# Patient Record
Sex: Male | Born: 1975 | Hispanic: Yes | Marital: Single | Smoking: Never smoker
Health system: Southern US, Community
[De-identification: ages and names within clinical notes are randomized; demographics above are authoritative.]

---

## 1998-08-25 ENCOUNTER — Emergency Department (HOSPITAL_COMMUNITY): Admission: EM | Admit: 1998-08-25 | Discharge: 1998-08-25 | Payer: Self-pay | Admitting: Emergency Medicine

## 1998-08-25 ENCOUNTER — Encounter: Payer: Self-pay | Admitting: Emergency Medicine

## 1998-08-28 ENCOUNTER — Ambulatory Visit (HOSPITAL_COMMUNITY): Admission: RE | Admit: 1998-08-28 | Discharge: 1998-08-28 | Payer: Self-pay | Admitting: Emergency Medicine

## 2013-12-04 ENCOUNTER — Ambulatory Visit (HOSPITAL_COMMUNITY)
Admission: RE | Admit: 2013-12-04 | Discharge: 2013-12-04 | Disposition: A | Discharge status: Home / Self Care | Payer: Self-pay | Source: Ambulatory Visit | Attending: Diagnostic Radiology | Admitting: Diagnostic Radiology

## 2013-12-04 ENCOUNTER — Other Ambulatory Visit (HOSPITAL_COMMUNITY): Payer: Self-pay | Admitting: Chiropractic Medicine

## 2013-12-04 DIAGNOSIS — M545 Low back pain: Secondary | ICD-10-CM

## 2013-12-04 DIAGNOSIS — M542 Cervicalgia: Secondary | ICD-10-CM

## 2016-05-03 ENCOUNTER — Ambulatory Visit (INDEPENDENT_AMBULATORY_CARE_PROVIDER_SITE_OTHER): Payer: Self-pay | Admitting: Urgent Care

## 2016-05-03 ENCOUNTER — Encounter (HOSPITAL_COMMUNITY): Payer: Self-pay

## 2016-05-03 ENCOUNTER — Encounter: Payer: Self-pay | Admitting: Urgent Care

## 2016-05-03 ENCOUNTER — Emergency Department (HOSPITAL_COMMUNITY)
Admission: EM | Admit: 2016-05-03 | Discharge: 2016-05-03 | Disposition: A | Discharge status: Home / Self Care | Payer: Self-pay | Attending: Emergency Medicine | Admitting: Emergency Medicine

## 2016-05-03 VITALS — BP 120/78 | HR 69 | Temp 98.1°F | Resp 18

## 2016-05-03 DIAGNOSIS — S61012A Laceration without foreign body of left thumb without damage to nail, initial encounter: Secondary | ICD-10-CM | POA: Insufficient documentation

## 2016-05-03 DIAGNOSIS — Y990 Civilian activity done for income or pay: Secondary | ICD-10-CM | POA: Insufficient documentation

## 2016-05-03 DIAGNOSIS — S65412A Laceration of blood vessel of left thumb, initial encounter: Secondary | ICD-10-CM

## 2016-05-03 DIAGNOSIS — Y929 Unspecified place or not applicable: Secondary | ICD-10-CM | POA: Insufficient documentation

## 2016-05-03 DIAGNOSIS — Y939 Activity, unspecified: Secondary | ICD-10-CM | POA: Insufficient documentation

## 2016-05-03 DIAGNOSIS — Z23 Encounter for immunization: Secondary | ICD-10-CM

## 2016-05-03 DIAGNOSIS — Z026 Encounter for examination for insurance purposes: Secondary | ICD-10-CM

## 2016-05-03 DIAGNOSIS — M79645 Pain in left finger(s): Secondary | ICD-10-CM

## 2016-05-03 DIAGNOSIS — W260XXA Contact with knife, initial encounter: Secondary | ICD-10-CM | POA: Insufficient documentation

## 2016-05-03 MED ORDER — LIDOCAINE HCL 1 % IJ SOLN
20.0000 mL | Freq: Once | INTRAMUSCULAR | Status: DC
  Filled 2016-05-03: qty 20

## 2016-05-03 MED ORDER — KETOROLAC TROMETHAMINE 60 MG/2ML IM SOLN: 60.0000 mg | Freq: Once | INTRAMUSCULAR | Status: DC

## 2016-05-03 MED ORDER — HYDROCODONE-ACETAMINOPHEN 5-325 MG PO TABS: 2.0000 | ORAL_TABLET | ORAL | 0 refills | Status: DC | PRN

## 2016-05-03 MED ORDER — LIDOCAINE HCL 2 % IJ SOLN
10.0000 mL | Freq: Once | INTRAMUSCULAR | Status: AC
  Administered 2016-05-03: 200 mg
  Filled 2016-05-03: qty 20

## 2016-05-03 NOTE — Progress Notes (Signed)
   MRN: [id number] DOB: 11/19/1975  Subjective:   Logan Townsend is a 41 y.o. male presenting for worker's comp visit.   Reports suffering a left thumb laceration while using a knife at work. Patient was cutting tile, knife slipped and cut his hand. He reports severe pain, difficulty moving thumb, profuse bleeding. He wrapped his thumb in a towel, did not wash or clean his hand. Cannot recall the last time he received a tetanus update.   Logan Townsend's medications list, allergies, past medical history and past surgical history were reviewed and excluded from this note due to being a worker's comp case.   Objective:   Vitals: BP 120/78 (BP Location: Right Arm, Patient Position: Sitting, Cuff Size: Small)   Pulse 69   Temp 98.1 F (36.7 C) (Oral)   Resp 18   SpO2 99%   Physical Exam  Constitutional: He is oriented to person, place, and time. He appears well-developed and well-nourished.  Cardiovascular: Normal rate.   Pulmonary/Chest: Effort normal.  Musculoskeletal:       Left hand: He exhibits decreased range of motion (flexion of left thumb), tenderness (over laceration), laceration (~3cm deep laceration) and swelling (of left thumb). He exhibits no bony tenderness, normal two-point discrimination, normal capillary refill and no deformity. Normal sensation noted. Decreased strength noted. He exhibits finger abduction and thumb/finger opposition.       Hands: Neurological: He is alert and oriented to person, place, and time.   Assessment and Plan :   Case precepted with Dr. Neva Townsend.  1. Encounter related to worker's compensation claim 2. Laceration of left thumb without foreign body without damage to nail, initial encounter 3. Laceration of blood vessel of left thumb, initial encounter 4. Pain of left thumb - Reported case to triage nurse at [facility]. Patient needs consult with hand surgery. He verbalized understanding.  5. Need for Tdap vaccination - Due to acuity of  patient's wound, the tdap was deferred.   Logan BambergMario Chandelle Harkey, PA-C Primary Care at [facility] [phone] 05/03/2016 1:36 PM

## 2016-05-03 NOTE — Patient Instructions (Addendum)
Por favor vaya al departemento de urgencias en [place] imediatamente. La direccion es:  Address: [address], [place], [place] [address]  Phone: [phone]     IF you received an x-ray today, you will receive an invoice from [facility]. Please contact [facility] at [phone] with questions or concerns regarding your invoice.   IF you received labwork today, you will receive an invoice from [facility]. Please contact [facility] at [phone] with questions or concerns regarding your invoice.   Our billing staff will not be able to assist you with questions regarding bills from these companies.  You will be contacted with the lab results as soon as they are available. The fastest way to get your results is to activate your My Chart account. Instructions are located on the last page of this paperwork. If you have not heard from us regarding the results in 2 weeks, please contact this office.

## 2016-05-03 NOTE — ED Provider Notes (Signed)
[facility] Provider Note   CSN: [id number] Arrival date & time: 05/03/16  1418  By signing my name below, I, Doreatha Martin, attest that this documentation has been prepared under the direction and in the presence of Rolland Porter, MD. Electronically Signed: Doreatha Martin, ED Scribe. 05/03/16. 2:52 PM.    History   Chief Complaint Chief Complaint  Patient presents with  . Finger Injury    HPI Logan Townsend is a 41 y.o. male who presents to the Emergency Department complaining of a laceration with controlled bleeding to the base of the left thumb that occurred 3 hours ago. Pt states he was using a knife at work when he accidentally cut his thumb. Bleeding controlled with dressing applied by UC, who referred him here. He denies additional injuries or complaints.   The history is provided by the patient. No language interpreter was used.    History reviewed. No pertinent past medical history.  There are no active problems to display for this patient.   History reviewed. No pertinent surgical history.     Home Medications    Prior to Admission medications   Medication Sig Start Date End Date Taking? Authorizing Provider  HYDROcodone-acetaminophen (NORCO/VICODIN) 5-325 MG tablet Take 2 tablets by mouth every 4 (four) hours as needed. 05/03/16   Rolland Porter, MD    Family History No family history on file.  Social History Social History  Substance Use Topics  . Smoking status: Never Smoker  . Smokeless tobacco: Never Used  . Alcohol use Yes     Comment: occasionally      Allergies   Patient has no known allergies.   Review of Systems Review of Systems  Constitutional: Negative for appetite change, chills, diaphoresis, fatigue and fever.  HENT: Negative for mouth sores, sore throat and trouble swallowing.   Eyes: Negative for visual disturbance.  Respiratory: Negative for cough, chest tightness, shortness of breath and wheezing.   Cardiovascular: Negative for chest  pain.  Gastrointestinal: Negative for abdominal distention, abdominal pain, diarrhea, nausea and vomiting.  Endocrine: Negative for polydipsia, polyphagia and polyuria.  Genitourinary: Negative for dysuria, frequency and hematuria.  Musculoskeletal: Negative for gait problem.  Skin: Positive for wound. Negative for color change, pallor and rash.  Neurological: Negative for dizziness, syncope, light-headedness and headaches.  Hematological: Does not bruise/bleed easily.  Psychiatric/Behavioral: Negative for behavioral problems and confusion.     Physical Exam Updated Vital Signs BP 119/76 (BP Location: Right Arm)   Pulse 74   Temp 98 F (36.7 C) (Oral)   Ht 5\' 9"  (1.753 m)   Wt 160 lb (72.6 kg)   SpO2 94%   BMI 23.63 kg/m   Physical Exam  Constitutional: He is oriented to person, place, and time. He appears well-developed and well-nourished. No distress.  HENT:  Head: Normocephalic.  Eyes: Conjunctivae are normal. Pupils are equal, round, and reactive to light. No scleral icterus.  Neck: Normal range of motion. Neck supple. No thyromegaly present.  Cardiovascular: Normal rate and regular rhythm.  Exam reveals no gallop and no friction rub.   No murmur heard. Pulmonary/Chest: Effort normal and breath sounds normal. No respiratory distress. He has no wheezes. He has no rales.  Abdominal: Soft. Bowel sounds are normal. He exhibits no distension. There is no tenderness. There is no rebound.  Musculoskeletal: Normal range of motion.  4 cm longitudinal laceration to the lateral left thumb that starts at P1 and ends at P2. Good sensation and movement. Small area of  pulsatile bleeding.    Neurological: He is alert and oriented to person, place, and time.  Skin: Skin is warm and dry. No rash noted.  Psychiatric: He has a normal mood and affect. His behavior is normal.  Nursing note and vitals reviewed.    ED Treatments / Results   DIAGNOSTIC STUDIES: Oxygen Saturation is 94% on  RA, adequate by my interpretation.    COORDINATION OF CARE: 2:45 PM Discussed treatment plan with pt at bedside which includes laceration repair and pt agreed to plan.    Labs (all labs ordered are listed, but only abnormal results are displayed) Labs Reviewed - No data to display  EKG  EKG Interpretation None       Radiology No results found.  Procedures Procedures (including critical care time)  NERVE BLOCK Performed by: Rolland PorterMark Taheera Thomann, MD  Consent: Verbal consent obtained. Required items: required blood products, implants, devices, and special equipment available Time out: Immediately prior to procedure a "time out" was called to verify the correct patient, procedure, equipment, support staff and site/side marked as required. Indication: lac repair  Nerve block body site: left thumb  Preparation: Patient was prepped and draped in the usual sterile fashion. Needle gauge: 27 G Location technique: anatomical landmarks Local anesthetic: 2% lidocaine  Anesthetic total: 10 ml Outcome: pain improved Patient tolerance: Patient tolerated the procedure well with no immediate complications.  LACERATION REPAIR Performed by: Rolland PorterMark Shelsey Rieth, MD  Consent: Verbal consent obtained. Risks and benefits: risks, benefits and alternatives were discussed Patient identity confirmed: provided demographic data Time out performed prior to procedure Prepped and Draped in normal sterile fashion Wound explored Laceration Location: lateral left thumb Laceration Length: 4 cm No Foreign Bodies seen or palpated Anesthesia: digital block as noted above Local anesthetic: lidocaine 2% without epinephrine Anesthetic total: 10 ml Irrigation method: syringe Amount of cleaning: standard Skin closure: 4-0 nylon  Number of sutures: 15 horizontal mattress, 1 running simple  Patient tolerance: Patient tolerated the procedure well with no immediate complications.   Medications Ordered in ED Medications    lidocaine (XYLOCAINE) 2 % (with pres) injection 200 mg (200 mg Infiltration Given 05/03/16 1520)     Initial Impression / Assessment and Plan / ED Course  I have reviewed the triage vital signs and the nursing notes.  Pertinent labs & imaging results that were available during my care of the patient were reviewed by me and considered in my medical decision making (see chart for details).      Final Clinical Impressions(s) / ED Diagnoses   Final diagnoses:  Laceration of left thumb without foreign body without damage to nail, initial encounter    After 1 hour, dressing removed. Finger taken though gentle RAM. No additional bleeding noted through sutured wound. Scant blood on gauze. Redressed with vaseline gauze, and dressing.   New Prescriptions New Prescriptions   HYDROCODONE-ACETAMINOPHEN (NORCO/VICODIN) 5-325 MG TABLET    Take 2 tablets by mouth every 4 (four) hours as needed.    Pressure irrigation performed. Laceration occurred < 8 hours prior to repair which was well tolerated. Pt has no co morbidities to effect normal wound healing. Discussed suture home care w pt and answered questions. Pt to f-u for wound check and suture removal in 7 days. Pt is hemodynamically stable w no complaints prior to dc.       Rolland PorterMark Versie Fleener, MD 05/03/16 1739

## 2016-05-03 NOTE — ED Triage Notes (Signed)
Per Pt, Pt reports cutting left thumb with knife at work. Went to [place] and was told to come here. Bleeding is controlled. Pt reports decreased pain since medication was given.

## 2016-05-03 NOTE — Discharge Instructions (Signed)
Do not remove dressing for 48 hours.  In 48 hours remove dressing.  Redress with  supplies given.  Sutures out in 10 days.

## 2016-05-14 ENCOUNTER — Ambulatory Visit: Payer: Self-pay

## 2018-01-07 ENCOUNTER — Ambulatory Visit (INDEPENDENT_AMBULATORY_CARE_PROVIDER_SITE_OTHER): Payer: Self-pay

## 2018-01-07 ENCOUNTER — Encounter (HOSPITAL_COMMUNITY): Payer: Self-pay

## 2018-01-07 ENCOUNTER — Ambulatory Visit (HOSPITAL_COMMUNITY)
Admission: EM | Admit: 2018-01-07 | Discharge: 2018-01-07 | Disposition: A | Discharge status: Home / Self Care | Payer: Self-pay | Attending: Family Medicine | Admitting: Family Medicine

## 2018-01-07 DIAGNOSIS — S62525B Nondisplaced fracture of distal phalanx of left thumb, initial encounter for open fracture: Secondary | ICD-10-CM

## 2018-01-07 MED ORDER — HYDROCODONE-ACETAMINOPHEN 5-325 MG PO TABS
ORAL_TABLET | ORAL | Status: AC
  Filled 2018-01-07: qty 2

## 2018-01-07 MED ORDER — CEPHALEXIN 500 MG PO CAPS: 500.0000 mg | ORAL_CAPSULE | Freq: Once | ORAL | Status: DC

## 2018-01-07 MED ORDER — HYDROCODONE-ACETAMINOPHEN 5-325 MG PO TABS: 2.0000 | ORAL_TABLET | ORAL | 0 refills | Status: AC | PRN

## 2018-01-07 MED ORDER — HYDROCODONE-ACETAMINOPHEN 5-325 MG PO TABS
2.0000 | ORAL_TABLET | Freq: Once | ORAL | Status: AC
  Administered 2018-01-07: 2 via ORAL

## 2018-01-07 MED ORDER — CEPHALEXIN 500 MG PO CAPS: 500.0000 mg | ORAL_CAPSULE | Freq: Four times a day (QID) | ORAL | 0 refills | Status: AC

## 2018-01-07 NOTE — Discharge Instructions (Signed)
Use ice and elevation to reduce pain and swelling Take antibiotic 4 times a day as prescribed Take pain medication as needed Do not drive on pain medication Call the orthopedic office tomorrow to set up an appointment in the next couple of days Keep dressing on the wound, keep clean and dry, dressing can stay on for 2 to 3 days

## 2018-01-07 NOTE — ED Triage Notes (Signed)
Reports cutting left thumb on table saw approx 20 min ago.  Bleeding controlled.  Triage eval done per Dr. Delton See.

## 2018-01-07 NOTE — ED Triage Notes (Signed)
Pt presents with laceration on left thumb from a table saw.

## 2018-01-07 NOTE — ED Provider Notes (Signed)
[facility]    CSN: [id number] Arrival date & time: 01/07/18  1306     History   Chief Complaint Chief Complaint  Patient presents with  . Extremity Laceration    HPI Logan Townsend is a 42 y.o. male.   HPI  Cut thumb today with a table saw.  Is here for repair.  Has full movement.  Normal sensation at the tip.  Very painful.  Bleeding is controlled with pressure.  No underlying medical problems.  Patient states tetanus is up-to-date, within 5 years.  He is here with his wife.  They have been in [place] for 17 years.  English comprehension is good.  They deny need for interpreter.  History reviewed. No pertinent past medical history.  There are no active problems to display for this patient.   History reviewed. No pertinent surgical history.     Home Medications    Prior to Admission medications   Medication Sig Start Date End Date Taking? Authorizing Provider  cephALEXin (KEFLEX) 500 MG capsule Take 1 capsule (500 mg total) by mouth 4 (four) times daily. 01/07/18   Eustace Moore, MD  HYDROcodone-acetaminophen (NORCO/VICODIN) 5-325 MG tablet Take 2 tablets by mouth every 4 (four) hours as needed. 01/07/18   Eustace Moore, MD    Family History History reviewed. No pertinent family history. Family history of diabetes.  No cancer heart disease Social History Social History   Tobacco Use  . Smoking status: Never Smoker  . Smokeless tobacco: Never Used  Substance Use Topics  . Alcohol use: Yes    Comment: occasionally   . Drug use: No     Allergies   Patient has no known allergies.   Review of Systems Review of Systems  Constitutional: Negative for chills and fever.  HENT: Negative for ear pain and sore throat.   Eyes: Negative for pain and visual disturbance.  Respiratory: Negative for cough and shortness of breath.   Cardiovascular: Negative for chest pain and palpitations.  Gastrointestinal: Negative for abdominal pain and  vomiting.  Genitourinary: Negative for dysuria and hematuria.  Musculoskeletal: Negative for arthralgias and back pain.  Skin: Positive for wound. Negative for color change and rash.  Neurological: Negative for seizures and syncope.  All other systems reviewed and are negative.    Physical Exam Triage Vital Signs ED Triage Vitals  Enc Vitals Group     BP 01/07/18 1323 (!) 143/98     Pulse Rate 01/07/18 1323 70     Resp 01/07/18 1323 18     Temp 01/07/18 1323 97.8 F (36.6 C)     Temp Source 01/07/18 1323 Oral     SpO2 01/07/18 1323 98 %     Weight --      Height --      Head Circumference --      Peak Flow --      Pain Score 01/07/18 1324 9     Pain Loc --      Pain Edu? --      Excl. in GC? --    No data found.  Updated Vital Signs BP (!) 143/98 (BP Location: Right Arm)   Pulse 70   Temp 97.8 F (36.6 C) (Oral)   Resp 18   SpO2 98%       Physical Exam  Constitutional: He appears well-developed and well-nourished. No distress.  HENT:  Head: Normocephalic and atraumatic.  Mouth/Throat: Oropharynx is clear and moist.  Eyes: Pupils are  equal, round, and reactive to light. Conjunctivae are normal.  Neck: Normal range of motion.  Cardiovascular: Normal rate.  Pulmonary/Chest: Effort normal. No respiratory distress.  Abdominal: Soft. He exhibits no distension.  Musculoskeletal: Normal range of motion. He exhibits no edema.  Neurological: He is alert.  Skin: Skin is warm and dry.  Macerated laceration as seen in the photographs.  There is flesh missing from the pad of the thumb and these edges can only be approximated.         UC Treatments / Results  Labs (all labs ordered are listed, but only abnormal results are displayed) Labs Reviewed - No data to display  EKG None  Radiology Dg Finger Thumb Left  Result Date: 01/07/2018 CLINICAL DATA:  Left thumb laceration. EXAM: LEFT THUMB 2+V COMPARISON:  None. FINDINGS: Mildly displaced oblique fracture is  seen involving the distal portion of the first distal phalanx. Soft tissue laceration is noted. No radiopaque foreign body is noted. Joint spaces are intact. IMPRESSION: Mildly displaced first distal phalangeal fracture with associated soft tissue laceration. Electronically Signed   By: Lupita Raider, M.D.   On: 01/07/2018 14:02    Procedures Laceration Repair Date/Time: 01/07/2018 8:02 PM Performed by: Eustace Moore, MD Authorized by: Eustace Moore, MD   Consent:    Consent obtained:  Verbal   Consent given by:  Patient and spouse   Risks discussed:  Infection, pain, poor cosmetic result, nerve damage and need for additional repair   Alternatives discussed:  No treatment Anesthesia (see MAR for exact dosages):    Anesthesia method:  Nerve block   Block needle gauge:  27 G   Block anesthetic:  Lidocaine 1% w/o epi   Block injection procedure:  Anatomic landmarks identified, anatomic landmarks palpated, introduced needle, negative aspiration for blood and incremental injection   Block outcome:  Anesthesia achieved Laceration details:    Location:  Finger   Finger location:  L thumb   Length (cm):  4   Depth (mm):  10 Repair type:    Repair type:  Simple Pre-procedure details:    Preparation:  Patient was prepped and draped in usual sterile fashion Exploration:    Hemostasis achieved with:  Direct pressure   Wound exploration: wound explored through full range of motion and entire depth of wound probed and visualized     Wound extent: areolar tissue violated, muscle damage and underlying fracture     Wound extent: no foreign bodies/material noted, no nerve damage noted and no tendon damage noted     Contaminated: yes   Treatment:    Area cleansed with:  Betadine   Amount of cleaning:  Standard   Irrigation solution:  Sterile saline   Irrigation volume:  40cc   Irrigation method:  Pressure wash   Visualized foreign bodies/material removed: no   Skin repair:     Repair method:  Sutures   Suture size:  4-0   Suture material:  Prolene   Suture technique:  Simple interrupted and horizontal mattress   Number of sutures:  6 Approximation:    Approximation:  Close Comments:     Discussed with patient that he has a nail deformity.  Ideally nail would be removed and the defect closed.  He refused nail removal.  Discussed with the patient and showed the wife that the upper part of the wound could be closed with good approximation of edges.  The pad of the finger has flesh missing and is closed  loosely with a mattress suture.    Medications Ordered in UC Medications  cephALEXin (KEFLEX) capsule 500 mg (has no administration in time range)  HYDROcodone-acetaminophen (NORCO/VICODIN) 5-325 MG per tablet 2 tablet (2 tablets Oral Given 01/07/18 1330)    Initial Impression / Assessment and Plan / UC Course  I have reviewed the triage vital signs and the nursing notes.  Pertinent labs & imaging results that were available during my care of the patient were reviewed by me and considered in my medical decision making (see chart for details).      Final Clinical Impressions(s) / UC Diagnoses   Final diagnoses:  Open nondisplaced fracture of distal phalanx of left thumb, initial encounter     Discharge Instructions     Use ice and elevation to reduce pain and swelling Take antibiotic 4 times a day as prescribed Take pain medication as needed Do not drive on pain medication Call the orthopedic office tomorrow to set up an appointment in the next couple of days Keep dressing on the wound, keep clean and dry, dressing can stay on for 2 to 3 days   ED Prescriptions    Medication Sig Dispense Auth. Provider   cephALEXin (KEFLEX) 500 MG capsule Take 1 capsule (500 mg total) by mouth 4 (four) times daily. 28 capsule Eustace Moore, MD   HYDROcodone-acetaminophen (NORCO/VICODIN) 5-325 MG tablet Take 2 tablets by mouth every 4 (four) hours as needed. 10  tablet Eustace Moore, MD     Controlled Substance Prescriptions [place] Controlled Substance Registry consulted? No   Eustace Moore, MD 01/07/18 2006

## 2019-12-25 IMAGING — DX DG FINGER THUMB 2+V*L*
3 series · 3 of 3 positions shown · non-contrast
Comparison: None.

CLINICAL DATA: Left thumb laceration.

EXAM:
LEFT THUMB 2+V

[finger ap]
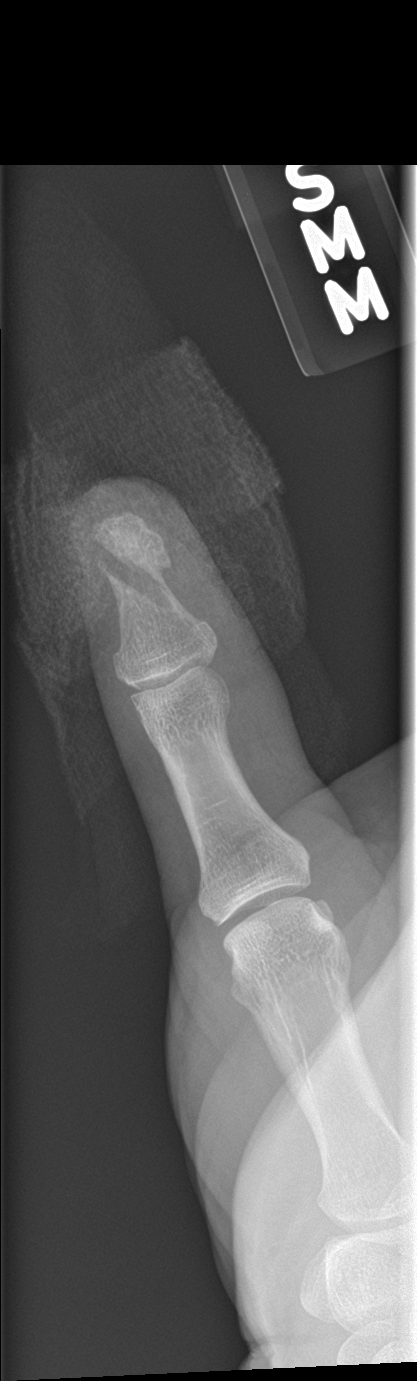

[finger obl]
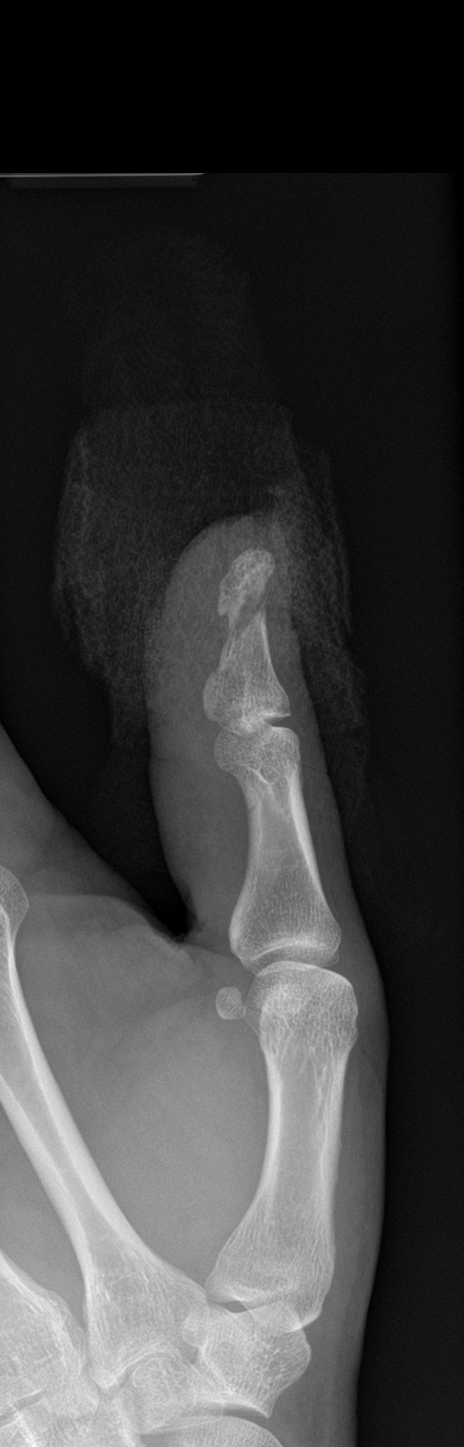

[finger lat]
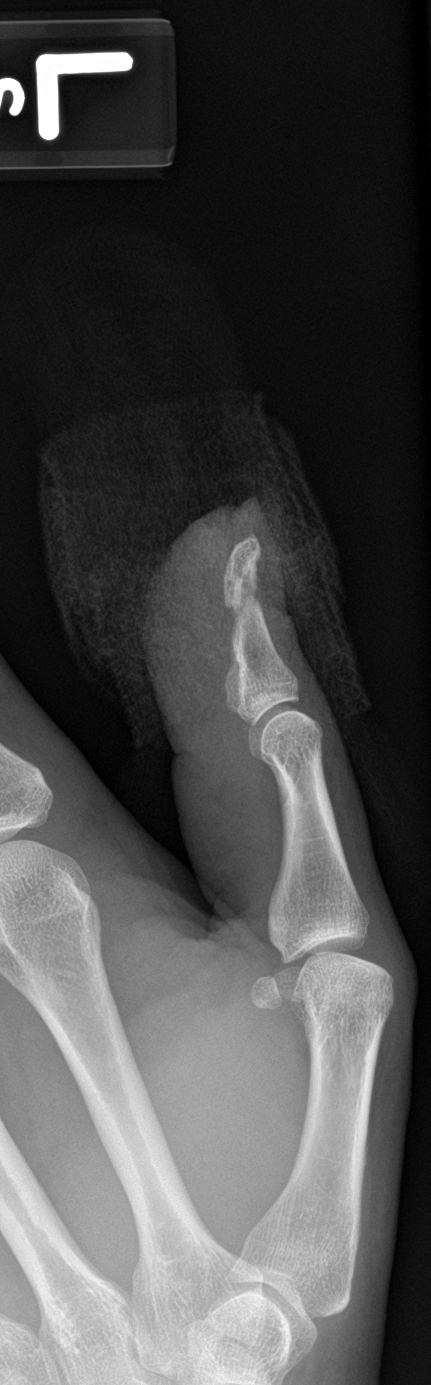

[3 of 3 positions shown; findings below may reference images not displayed]

FINDINGS: Mildly displaced oblique fracture is seen involving the distal
portion of the first distal phalanx. Soft tissue laceration is
noted. No radiopaque foreign body is noted. Joint spaces are intact.
IMPRESSION: Mildly displaced first distal phalangeal fracture with associated
soft tissue laceration.

## 2020-12-27 ENCOUNTER — Other Ambulatory Visit: Payer: Self-pay

## 2020-12-27 ENCOUNTER — Encounter (HOSPITAL_COMMUNITY): Payer: Self-pay | Admitting: *Deleted

## 2020-12-27 ENCOUNTER — Ambulatory Visit (HOSPITAL_COMMUNITY)
Admission: EM | Admit: 2020-12-27 | Discharge: 2020-12-27 | Disposition: A | Discharge status: Home / Self Care | Payer: Self-pay | Attending: Emergency Medicine | Admitting: Emergency Medicine

## 2020-12-27 DIAGNOSIS — N23 Unspecified renal colic: Secondary | ICD-10-CM

## 2020-12-27 LAB — POCT URINALYSIS DIPSTICK, ED / UC
Bilirubin Urine: NEGATIVE
Glucose, UA: NEGATIVE mg/dL
Ketones, ur: NEGATIVE mg/dL
Leukocytes,Ua: NEGATIVE
Nitrite: NEGATIVE
Protein, ur: NEGATIVE mg/dL
Specific Gravity, Urine: 1.025 (ref 1.005–1.030)
Urobilinogen, UA: 0.2 mg/dL (ref 0.0–1.0)
pH: 5 (ref 5.0–8.0)

## 2020-12-27 MED ORDER — TAMSULOSIN HCL 0.4 MG PO CAPS: 0.4000 mg | ORAL_CAPSULE | Freq: Every day | ORAL | 0 refills | Status: AC

## 2020-12-27 NOTE — Discharge Instructions (Signed)
Take the Flomax daily at night.  You may stop this medication once your pain completely resolves or you notice a stone in your urine.  Make sure you are drinking plenty of fluids, especially water.  Follow-up with your primary care provider for reevaluation as soon as possible.  Go to the emergency room for reevaluation if your pain gets worse, you are unable to urinate, or you start urinating bright red blood.

## 2020-12-27 NOTE — ED Provider Notes (Addendum)
[facility]    CSN: [id number] Arrival date & time: 12/27/20  1708      History   Chief Complaint Chief Complaint  Patient presents with   Abdominal Pain    HPI Logan Townsend is a 45 y.o. male.   Patient here for evaluation of right side and abdominal pain that started around 1 PM today.  Reports pain is sharp and intermittent.  Patient reports that he was driving when pain started and has not had the pain in over an hour.  Denies having similar pain in the past.  Has not taken any OTC medications or treatments.  Denies any trauma, injury, or other precipitating event.  Denies any specific alleviating or aggravating factors.  Denies any fevers, chest pain, shortness of breath, N/V/D, numbness, tingling, weakness, abdominal pain, or headaches.    The history is provided by the patient. The history is limited by a language barrier. A language interpreter was used.  Abdominal Pain  History reviewed. No pertinent past medical history.  There are no problems to display for this patient.   History reviewed. No pertinent surgical history.     Home Medications    Prior to Admission medications   Medication Sig Start Date End Date Taking? Authorizing Provider  tamsulosin (FLOMAX) 0.4 MG CAPS capsule Take 1 capsule (0.4 mg total) by mouth daily after supper. 12/27/20  Yes Ivette Loyal, NP  cephALEXin (KEFLEX) 500 MG capsule Take 1 capsule (500 mg total) by mouth 4 (four) times daily. 01/07/18   Eustace Moore, MD  HYDROcodone-acetaminophen (NORCO/VICODIN) 5-325 MG tablet Take 2 tablets by mouth every 4 (four) hours as needed. 01/07/18   Eustace Moore, MD    Family History History reviewed. No pertinent family history.  Social History Social History   Tobacco Use   Smoking status: Never   Smokeless tobacco: Never  Substance Use Topics   Alcohol use: Yes    Comment: occasionally    Drug use: No     Allergies   Patient has no known  allergies.   Review of Systems Review of Systems  Gastrointestinal:  Positive for abdominal pain.  All other systems reviewed and are negative.   Physical Exam Triage Vital Signs ED Triage Vitals [12/27/20 1832]  Enc Vitals Group     BP (!) 144/91     Pulse Rate (!) 57     Resp      Temp 97.9 F (36.6 C)     Temp Source Oral     SpO2 96 %     Weight      Height      Head Circumference      Peak Flow      Pain Score      Pain Loc      Pain Edu?      Excl. in GC?    No data found.  Updated Vital Signs BP (!) 154/94   Pulse 66   Temp 98.1 F (36.7 C)   Resp 18   SpO2 97%   Visual Acuity Right Eye Distance:   Left Eye Distance:   Bilateral Distance:    Right Eye Near:   Left Eye Near:    Bilateral Near:     Physical Exam Vitals and nursing note reviewed.  Constitutional:      General: He is not in acute distress.    Appearance: Normal appearance. He is not ill-appearing, toxic-appearing or diaphoretic.  HENT:  Head: Normocephalic and atraumatic.  Eyes:     Conjunctiva/sclera: Conjunctivae normal.  Cardiovascular:     Rate and Rhythm: Normal rate.     Pulses: Normal pulses.  Pulmonary:     Effort: Pulmonary effort is normal.  Abdominal:     General: Abdomen is flat.     Palpations: Abdomen is soft.     Tenderness: There is no abdominal tenderness. There is no right CVA tenderness, left CVA tenderness, guarding or rebound. Negative signs include Murphy's sign, McBurney's sign and psoas sign.  Musculoskeletal:        General: Normal range of motion.     Cervical back: Normal range of motion.  Skin:    General: Skin is warm and dry.  Neurological:     General: No focal deficit present.     Mental Status: He is alert and oriented to person, place, and time.  Psychiatric:        Mood and Affect: Mood normal.     UC Treatments / Results  Labs (all labs ordered are listed, but only abnormal results are displayed) Labs Reviewed  POCT URINALYSIS  DIPSTICK, ED / UC - Abnormal; Notable for the following components:      Result Value   Hgb urine dipstick TRACE (*)    All other components within normal limits    EKG   Radiology No results found.  Procedures Procedures (including critical care time)  Medications Ordered in UC Medications - No data to display  Initial Impression / Assessment and Plan / UC Course  I have reviewed the triage vital signs and the nursing notes.  Pertinent labs & imaging results that were available during my care of the patient were reviewed by me and considered in my medical decision making (see chart for details).    Assessment negative for red flags or concerns.  Urinalysis positive for hemoglobin but otherwise negative with no signs of infection.  Based on location of pain and hematuria concern for renal colic of the right side.  We will treat with Flomax daily and aggressive hydration.  May take Tylenol and/or ibuprofen as needed for pain.  Follow-up with primary care for reevaluation of soon as possible.  Strict ED follow-up for any worsening symptoms. Final Clinical Impressions(s) / UC Diagnoses   Final diagnoses:  Renal colic on right side     Discharge Instructions      Take the Flomax daily at night.  You may stop this medication once your pain completely resolves or you notice a stone in your urine.  Make sure you are drinking plenty of fluids, especially water.  Follow-up with your primary care provider for reevaluation as soon as possible.  Go to the emergency room for reevaluation if your pain gets worse, you are unable to urinate, or you start urinating bright red blood.     ED Prescriptions     Medication Sig Dispense Auth. Provider   tamsulosin (FLOMAX) 0.4 MG CAPS capsule Take 1 capsule (0.4 mg total) by mouth daily after supper. 30 capsule Pearson Forster, NP      PDMP not reviewed this encounter.   Pearson Forster, NP 12/27/20 1900    Pearson Forster,  NP 12/27/20 1900

## 2020-12-27 NOTE — ED Triage Notes (Signed)
Pt reports RT ABD pain that started today.

## 2021-02-11 ENCOUNTER — Encounter (HOSPITAL_COMMUNITY): Payer: Self-pay

## 2021-02-11 ENCOUNTER — Ambulatory Visit (INDEPENDENT_AMBULATORY_CARE_PROVIDER_SITE_OTHER): Payer: Self-pay

## 2021-02-11 ENCOUNTER — Ambulatory Visit (HOSPITAL_COMMUNITY)
Admission: EM | Admit: 2021-02-11 | Discharge: 2021-02-11 | Disposition: A | Discharge status: Home / Self Care | Payer: Self-pay | Attending: Emergency Medicine | Admitting: Emergency Medicine

## 2021-02-11 ENCOUNTER — Other Ambulatory Visit: Payer: Self-pay

## 2021-02-11 DIAGNOSIS — M79644 Pain in right finger(s): Secondary | ICD-10-CM

## 2021-02-11 MED ORDER — NAPROXEN 500 MG PO TABS: 500.0000 mg | ORAL_TABLET | Freq: Two times a day (BID) | ORAL | 0 refills | Status: AC

## 2021-02-11 NOTE — Discharge Instructions (Signed)
Your x-ray did not show injury to the bone or the joint space  I believe your symptoms are being caused by irritation to the tissue which is causing swelling  Take naproxen twice a day, every morning and every night for the next 5 days then you may use the medicine only if you need, this medicine is to help calm the swelling, once the swelling goes down you should be able to move as normal  You can wrap your finger in a bandage for support if you are going to continue to work  You may place heat over your hand in 15-minute intervals for comfort  If symptoms continue to persist you may follow-up with urgent care or with orthopedic specialist, their information is listed below

## 2021-02-11 NOTE — ED Provider Notes (Signed)
[facility]    CSN: [id number] Arrival date & time: 02/11/21  1638      History   Chief Complaint Chief Complaint  Patient presents with   Hand Pain    HPI Kharon Hixon is a 45 y.o. male.   Patient presents with pain and swelling to the fourth finger for 1 day.  Limited range of motion.  Denies numbness or tingling, prior injury or trauma.  Unsure of injury as he endorses that he was working yesterday prior to pain beginning.  Not attempted treatment of symptoms.  No pertinent medical history.  History reviewed. No pertinent past medical history.  There are no problems to display for this patient.   History reviewed. No pertinent surgical history.     Home Medications    Prior to Admission medications   Medication Sig Start Date End Date Taking? Authorizing Provider  cephALEXin (KEFLEX) 500 MG capsule Take 1 capsule (500 mg total) by mouth 4 (four) times daily. 01/07/18   Eustace Moore, MD  HYDROcodone-acetaminophen (NORCO/VICODIN) 5-325 MG tablet Take 2 tablets by mouth every 4 (four) hours as needed. 01/07/18   Eustace Moore, MD  tamsulosin (FLOMAX) 0.4 MG CAPS capsule Take 1 capsule (0.4 mg total) by mouth daily after supper. 12/27/20   Ivette Loyal, NP    Family History History reviewed. No pertinent family history.  Social History Social History   Tobacco Use   Smoking status: Never   Smokeless tobacco: Never  Substance Use Topics   Alcohol use: Yes    Comment: occasionally    Drug use: Never     Allergies   Patient has no known allergies.   Review of Systems Review of Systems  Constitutional: Negative.   Respiratory: Negative.    Cardiovascular: Negative.   Musculoskeletal:  Positive for joint swelling. Negative for arthralgias, back pain, gait problem, myalgias, neck pain and neck stiffness.  Skin: Negative.   Neurological: Negative.     Physical Exam Triage Vital Signs ED Triage Vitals  Enc Vitals  Group     BP 02/11/21 0855 (!) 142/86     Pulse Rate 02/11/21 0855 72     Resp 02/11/21 0855 16     Temp 02/11/21 0855 97.9 F (36.6 C)     Temp Source 02/11/21 0855 Oral     SpO2 02/11/21 0855 97 %     Weight --      Height --      Head Circumference --      Peak Flow --      Pain Score 02/11/21 0854 4     Pain Loc --      Pain Edu? --      Excl. in GC? --    No data found.  Updated Vital Signs BP (!) 142/86 (BP Location: Right Arm)    Pulse 72    Temp 97.9 F (36.6 C) (Oral)    Resp 16    SpO2 97%   Visual Acuity Right Eye Distance:   Left Eye Distance:   Bilateral Distance:    Right Eye Near:   Left Eye Near:    Bilateral Near:     Physical Exam Constitutional:      Appearance: Normal appearance. He is normal weight.  HENT:     Head: Normocephalic.  Eyes:     Extraocular Movements: Extraocular movements intact.  Musculoskeletal:     Comments: Tenderness and mild to moderate swelling of the distal phalanx of  the right fourth finger, range of motion limited, unable to fully extend or flex, sensation intact, 2+ radial pulse  Neurological:     Mental Status: He is alert and oriented to person, place, and time. Mental status is at baseline.  Psychiatric:        Mood and Affect: Mood normal.        Behavior: Behavior normal.     UC Treatments / Results  Labs (all labs ordered are listed, but only abnormal results are displayed) Labs Reviewed - No data to display  EKG   Radiology No results found.  Procedures Procedures (including critical care time)  Medications Ordered in UC Medications - No data to display  Initial Impression / Assessment and Plan / UC Course  I have reviewed the triage vital signs and the nursing notes.  Pertinent labs & imaging results that were available during my care of the patient were reviewed by me and considered in my medical decision making (see chart for details).  Right finger pain  1.  X-ray negative 2.  Naproxen  500 mg twice daily for 5 days then as needed 3.  Recommended RICE 4.  Urgent care or orthopedic follow-up as needed Final Clinical Impressions(s) / UC Diagnoses   Final diagnoses:  None   Discharge Instructions   None    ED Prescriptions   None    PDMP not reviewed this encounter.   Valinda Hoar, NP 02/11/21 1007

## 2021-02-11 NOTE — ED Triage Notes (Signed)
Pt reports pain and swelling in right ring finger x 1 day. Denies any injury.

## 2022-12-19 ENCOUNTER — Other Ambulatory Visit: Payer: Self-pay

## 2022-12-19 ENCOUNTER — Emergency Department (HOSPITAL_COMMUNITY)
Admission: EM | Admit: 2022-12-19 | Discharge: 2022-12-19 | Disposition: A | Discharge status: Home / Self Care | Payer: Self-pay | Attending: Emergency Medicine | Admitting: Emergency Medicine

## 2022-12-19 ENCOUNTER — Encounter (HOSPITAL_COMMUNITY): Payer: Self-pay

## 2022-12-19 ENCOUNTER — Emergency Department (HOSPITAL_COMMUNITY): Payer: Self-pay

## 2022-12-19 DIAGNOSIS — W312XXA Contact with powered woodworking and forming machines, initial encounter: Secondary | ICD-10-CM | POA: Insufficient documentation

## 2022-12-19 DIAGNOSIS — S61213A Laceration without foreign body of left middle finger without damage to nail, initial encounter: Secondary | ICD-10-CM | POA: Insufficient documentation

## 2022-12-19 DIAGNOSIS — S61215A Laceration without foreign body of left ring finger without damage to nail, initial encounter: Secondary | ICD-10-CM | POA: Insufficient documentation

## 2022-12-19 DIAGNOSIS — Y93D9 Activity, other involving arts and handcrafts: Secondary | ICD-10-CM | POA: Insufficient documentation

## 2022-12-19 DIAGNOSIS — S62639B Displaced fracture of distal phalanx of unspecified finger, initial encounter for open fracture: Secondary | ICD-10-CM | POA: Insufficient documentation

## 2022-12-19 DIAGNOSIS — Z23 Encounter for immunization: Secondary | ICD-10-CM | POA: Insufficient documentation

## 2022-12-19 LAB — CBC WITH DIFFERENTIAL/PLATELET
Abs Immature Granulocytes: 0.04 10*3/uL (ref 0.00–0.07)
Basophils Absolute: 0 10*3/uL (ref 0.0–0.1)
Basophils Relative: 0 %
Eosinophils Absolute: 0.1 10*3/uL (ref 0.0–0.5)
Eosinophils Relative: 1 %
HCT: 46.1 % (ref 39.0–52.0)
Hemoglobin: 17.2 g/dL — ABNORMAL HIGH (ref 13.0–17.0)
Immature Granulocytes: 0 %
Lymphocytes Relative: 34 %
Lymphs Abs: 3.1 10*3/uL (ref 0.7–4.0)
MCH: 31.6 pg (ref 26.0–34.0)
MCHC: 37.3 g/dL — ABNORMAL HIGH (ref 30.0–36.0)
MCV: 84.6 fL (ref 80.0–100.0)
Monocytes Absolute: 0.7 10*3/uL (ref 0.1–1.0)
Monocytes Relative: 8 %
Neutro Abs: 5.1 10*3/uL (ref 1.7–7.7)
Neutrophils Relative %: 57 %
Platelets: 182 10*3/uL (ref 150–400)
RBC: 5.45 MIL/uL (ref 4.22–5.81)
RDW: 12.5 % (ref 11.5–15.5)
WBC: 9.2 10*3/uL (ref 4.0–10.5)
nRBC: 0 % (ref 0.0–0.2)

## 2022-12-19 LAB — COMPREHENSIVE METABOLIC PANEL
ALT: 38 U/L (ref 0–44)
AST: 29 U/L (ref 15–41)
Albumin: 4.1 g/dL (ref 3.5–5.0)
Alkaline Phosphatase: 70 U/L (ref 38–126)
Anion gap: 12 (ref 5–15)
BUN: 15 mg/dL (ref 6–20)
CO2: 22 mmol/L (ref 22–32)
Calcium: 9.3 mg/dL (ref 8.9–10.3)
Chloride: 105 mmol/L (ref 98–111)
Creatinine, Ser: 1.07 mg/dL (ref 0.61–1.24)
GFR, Estimated: >60 mL/min (ref >60)
Glucose, Bld: 146 mg/dL — ABNORMAL HIGH (ref 70–99)
Potassium: 3.5 mmol/L (ref 3.5–5.1)
Sodium: 139 mmol/L (ref 135–145)
Total Bilirubin: 0.8 mg/dL (ref 0.3–1.2)
Total Protein: 7.3 g/dL (ref 6.5–8.1)

## 2022-12-19 MED ORDER — CEFAZOLIN SODIUM-DEXTROSE 1-4 GM/50ML-% IV SOLN
1.0000 g | Freq: Once | INTRAVENOUS | Status: AC
  Administered 2022-12-19: 1 g via INTRAVENOUS
  Filled 2022-12-19: qty 50

## 2022-12-19 MED ORDER — AMOXICILLIN-POT CLAVULANATE 875-125 MG PO TABS: 1.0000 | ORAL_TABLET | Freq: Two times a day (BID) | ORAL | 0 refills | Status: AC

## 2022-12-19 MED ORDER — TETANUS-DIPHTH-ACELL PERTUSSIS 5-2.5-18.5 LF-MCG/0.5 IM SUSY
0.5000 mL | PREFILLED_SYRINGE | Freq: Once | INTRAMUSCULAR | Status: AC
  Administered 2022-12-19: 0.5 mL via INTRAMUSCULAR
  Filled 2022-12-19: qty 0.5

## 2022-12-19 MED ORDER — DOXYCYCLINE HYCLATE 100 MG PO CAPS: 100.0000 mg | ORAL_CAPSULE | Freq: Two times a day (BID) | ORAL | 0 refills | Status: AC

## 2022-12-19 MED ORDER — FENTANYL CITRATE PF 50 MCG/ML IJ SOSY
50.0000 ug | PREFILLED_SYRINGE | Freq: Once | INTRAMUSCULAR | Status: AC
  Administered 2022-12-19: 50 ug via INTRAVENOUS
  Filled 2022-12-19: qty 1

## 2022-12-19 MED ORDER — MORPHINE SULFATE 15 MG PO TABS: 15.0000 mg | ORAL_TABLET | Freq: Four times a day (QID) | ORAL | 0 refills | Status: AC | PRN

## 2022-12-19 MED ORDER — LIDOCAINE HCL (PF) 1 % IJ SOLN
30.0000 mL | Freq: Once | INTRAMUSCULAR | Status: AC
  Administered 2022-12-19: 30 mL
  Filled 2022-12-19: qty 30

## 2022-12-19 NOTE — ED Provider Notes (Signed)
[place] EMERGENCY DEPARTMENT AT [facility] Provider Note   CSN: [id number] Arrival date & time: 12/19/22  1350     History  Chief Complaint  Patient presents with   Finger Injury    Logan Townsend is a 47 y.o. male.  Who is otherwise healthy presenting for a left hand injury.  Patient was working today cutting wood with a table saw when he suffered a laceration to his left hand.  Now with partial amputation of left index finger and lacerations to the middle finger and ring finger on this hand as well.  No other injuries.  Uncertain tetanus status.  He is right-hand dominant.  HPI     Home Medications Prior to Admission medications   Medication Sig Start Date End Date Taking? Authorizing Provider  amoxicillin-clavulanate (AUGMENTIN) 875-125 MG tablet Take 1 tablet by mouth every 12 (twelve) hours. 12/19/22  Yes Royanne Foots, DO  doxycycline (VIBRAMYCIN) 100 MG capsule Take 1 capsule (100 mg total) by mouth 2 (two) times daily. 12/19/22  Yes Estelle June A, DO  morphine (MSIR) 15 MG tablet Take 1 tablet (15 mg total) by mouth every 6 (six) hours as needed for up to 3 days for severe pain (pain score 7-10). 12/19/22 12/22/22 Yes Royanne Foots, DO  cephALEXin (KEFLEX) 500 MG capsule Take 1 capsule (500 mg total) by mouth 4 (four) times daily. 01/07/18   Eustace Moore, MD  HYDROcodone-acetaminophen (NORCO/VICODIN) 5-325 MG tablet Take 2 tablets by mouth every 4 (four) hours as needed. 01/07/18   Eustace Moore, MD  naproxen (NAPROSYN) 500 MG tablet Take 1 tablet (500 mg total) by mouth 2 (two) times daily. 02/11/21   Valinda Hoar, NP  tamsulosin (FLOMAX) 0.4 MG CAPS capsule Take 1 capsule (0.4 mg total) by mouth daily after supper. 12/27/20   Ivette Loyal, NP      Allergies    Patient has no known allergies.    Review of Systems   Review of Systems  Physical Exam Updated Vital Signs BP 124/77   Pulse (!) 58   Temp 98.5 F (36.9  C) (Oral)   Resp 18   Ht 5\' 9"  (1.753 m)   Wt 72.6 kg   SpO2 99%   BMI 23.64 kg/m  Physical Exam Vitals and nursing note reviewed.  HENT:     Head: Normocephalic and atraumatic.  Eyes:     Pupils: Pupils are equal, round, and reactive to light.  Cardiovascular:     Rate and Rhythm: Normal rate and regular rhythm.  Pulmonary:     Effort: Pulmonary effort is normal.     Breath sounds: Normal breath sounds.  Abdominal:     Palpations: Abdomen is soft.     Tenderness: There is no abdominal tenderness.  Musculoskeletal:     Comments: Partial amputation of distal left index finger distal to DIP Deep lacerations over ulnar aspect of distal left middle finger and distal left ring finger Strength testing limited in left hand secondary to pain  Skin:    General: Skin is warm and dry.  Neurological:     Mental Status: He is alert.  Psychiatric:        Mood and Affect: Mood normal.      ED Results / Procedures / Treatments   Labs (all labs ordered are listed, but only abnormal results are displayed) Labs Reviewed  COMPREHENSIVE METABOLIC PANEL - Abnormal; Notable for the following components:  Result Value   Glucose, Bld 146 (*)    All other components within normal limits  CBC WITH DIFFERENTIAL/PLATELET - Abnormal; Notable for the following components:   Hemoglobin 17.2 (*)    MCHC 37.3 (*)    All other components within normal limits    EKG None  Radiology DG Hand Complete Left  Result Date: 12/19/2022 CLINICAL DATA:  Amputation injury. Using a table saw to cut wood and cut fingers on left hand. The tip of patient's second finger is hanging off. The third and fourth fingers are cut halfway through. EXAM: LEFT HAND - COMPLETE 3+ VIEW COMPARISON:  None Available. FINDINGS: There is a saw injury to the distal phalanx of the index finger with multiple linear fracture lines, marked comminution, and up to approximately 3 mm diastasis of fracture lines at the region of the  proximal metaphysis of the distal phalanx. 2 mm dorsal cortical step-off of the fracture lines on lateral view. There is a saw injury extending through the soft tissues and proximal metaphysis and diaphysis of the distal phalanx of the third finger not significant bony displacement. There is a mild triangular defect within the distal medial fourth finger soft tissues. A 2 mm fleck of bone is seen just medial to the distal shaft of the distal phalanx of the fourth finger, however otherwise no acute fracture is identified. Joint spaces are preserved.  No dislocation. IMPRESSION: 1. There is a comminuted and mildly displaced fracture of the distal phalanx of the index finger. 2. There is a nondisplaced fracture of the distal phalanx of the third finger. 3. There is a mild triangular defect within the distal medial fourth finger soft tissues. A 2 mm fleck of bone is seen just medial to the distal shaft of the distal phalanx of the fourth finger, however otherwise no acute fracture is identified. 4. Please note that the second and third distal phalangeal fractures and potential tiny superficial fourth distal phalanx fracture are "open fractures" from the patient's table saw injury. Electronically Signed   By: Neita Garnet M.D.   On: 12/19/2022 16:08    Procedures .Nerve Block  Date/Time: 12/19/2022 5:36 PM  Performed by: Royanne Foots, DO Authorized by: Royanne Foots, DO   Consent:    Consent obtained:  Verbal   Consent given by:  Patient   Risks, benefits, and alternatives were discussed: yes     Risks discussed:  Bleeding, infection, pain and unsuccessful block   Alternatives discussed:  No treatment, delayed treatment and alternative treatment Universal protocol:    Procedure explained and questions answered to patient or proxy's satisfaction: yes     Relevant documents present and verified: yes     Test results available: yes     Imaging studies available: yes     Required blood products,  implants, devices, and special equipment available: yes     Site/side marked: yes     Immediately prior to procedure, a time out was called: yes     Patient identity confirmed:  Verbally with patient Indications:    Indications:  Pain relief Location:    Body area:  Upper extremity (Digital nerve block of left index, middle and ring finger)   Laterality:  Left Pre-procedure details:    Skin preparation:  Povidone-iodine Skin anesthesia:    Skin anesthesia method:  None Procedure details:    Block needle gauge:  22 G   Anesthetic injected:  Lidocaine 1% w/o epi   Steroid injected:  None   Injection procedure:  Anatomic landmarks identified   Paresthesia:  None Post-procedure details:    Dressing:  Sterile dressing   Outcome:  Anesthesia achieved   Procedure completion:  Tolerated     Medications Ordered in ED Medications  fentaNYL (SUBLIMAZE) injection 50 mcg (50 mcg Intravenous Given 12/19/22 1439)  Tdap (BOOSTRIX) injection 0.5 mL (0.5 mLs Intramuscular Given 12/19/22 1441)  ceFAZolin (ANCEF) IVPB 1 g/50 mL premix (0 g Intravenous Stopped 12/19/22 1512)  lidocaine (PF) (XYLOCAINE) 1 % injection 30 mL (30 mLs Infiltration Given by Other 12/19/22 1619)    ED Course/ Medical Decision Making/ A&P Clinical Course as of 12/19/22 1748  Mon Dec 19, 2022  1458 Ortho PA has evaluated patient in the ED.  Will place digital block for comfort awaiting hand surgery's plan [MP]  1627 Ortho PA has discussed case with Dr. Yehuda Budd (hand) who recommends washout and dressing.  Plan for outpatient follow-up in the office in 2 days to discuss further operative management [MP]  1734 Digital block was placed and lacerations were thoroughly irrigated with normal saline/iodine solution.  Unable to approximate lacerations with suturing.  Bleeding was controlled.  Placed wet-to-dry gauze dressing and covered with bulky gauze roll.  Will discharge patient with outpatient prescriptions for doxycycline and  Augmentin as well as, p.o. morphine and instruction for hand follow-up in 2 days. [MP]    Clinical Course User Index [MP] Royanne Foots, DO                                 Medical Decision Making 47 year old male presenting for left hand injury.  Laceration inflicted from table saw at work today.  Now with partial amputation of left index finger with deep lacerations to the middle and index finger of this hand as well.  X-ray reveals associated fractures of the index finger and middle finger.  Will provide analgesia with fentanyl, cover with Ancef, provide Tdap and consult Ortho/hand surgery  Amount and/or Complexity of Data Reviewed Labs: ordered.  Risk Prescription drug management.           Final Clinical Impression(s) / ED Diagnoses Final diagnoses:  Open fracture of tuft of distal phalanx of finger  Laceration of left middle finger, foreign body presence unspecified, nail damage status unspecified, initial encounter  Laceration of left ring finger without damage to nail, foreign body presence unspecified, initial encounter    Rx / DC Orders ED Discharge Orders          Ordered    morphine (MSIR) 15 MG tablet  Every 6 hours PRN        12/19/22 1745    doxycycline (VIBRAMYCIN) 100 MG capsule  2 times daily        12/19/22 1745    amoxicillin-clavulanate (AUGMENTIN) 875-125 MG tablet  Every 12 hours        12/19/22 1745              Royanne Foots, DO 12/19/22 1748

## 2022-12-19 NOTE — ED Notes (Signed)
I&D kit, lidocaine, needles and syringes at bedside for EDP.

## 2022-12-19 NOTE — Consult Note (Signed)
Reason for Consult:Left hand injury Referring Physician: Estelle June Time called: 1425 Time at bedside: 1435   Logan Townsend is an 47 y.o. male.  HPI: [name] got his left hand caught in a table saw while working on his house. He was brought to the ED and hand surgery was consulted. He is RHD.  History reviewed. No pertinent past medical history.  History reviewed. No pertinent surgical history.  History reviewed. No pertinent family history.  Social History:  reports that he has never smoked. He has never used smokeless tobacco. He reports current alcohol use. He reports that he does not use drugs.  Allergies: No Known Allergies  Medications: I have reviewed the patient's current medications.  No results found for this or any previous visit (from the past 48 hour(s)).  No results found.  Review of Systems  HENT:  Negative for ear discharge, ear pain, hearing loss and tinnitus.   Eyes:  Negative for photophobia and pain.  Respiratory:  Negative for cough and shortness of breath.   Cardiovascular:  Negative for chest pain.  Gastrointestinal:  Negative for abdominal pain, nausea and vomiting.  Genitourinary:  Negative for dysuria, flank pain, frequency and urgency.  Musculoskeletal:  Positive for arthralgias (Left fingers 2-4). Negative for back pain, myalgias and neck pain.  Neurological:  Negative for dizziness and headaches.  Hematological:  Does not bruise/bleed easily.  Psychiatric/Behavioral:  The patient is not nervous/anxious.    Blood pressure 124/77, pulse (!) 58, temperature 98.5 F (36.9 C), temperature source Oral, resp. rate 18, height 5\' 9"  (1.753 m), weight 72.6 kg, SpO2 99%. Physical Exam Constitutional:      General: He is not in acute distress.    Appearance: He is well-developed. He is not diaphoretic.  HENT:     Head: Normocephalic and atraumatic.  Eyes:     General: No scleral icterus.       Right eye: No discharge.        Left eye: No  discharge.     Conjunctiva/sclera: Conjunctivae normal.  Cardiovascular:     Rate and Rhythm: Normal rate and regular rhythm.  Pulmonary:     Effort: Pulmonary effort is normal. No respiratory distress.  Musculoskeletal:     Cervical back: Normal range of motion.     Comments: Left shoulder, elbow, wrist, digits- Partial amputation index P3, lacerations to long/ring P3, sensation intact except ulnar aspect index, mod TTP, no instability, no blocks to motion  Sens  Ax/R/M/U intact  Mot   Ax/ R/ PIN/ M/ AIN/ U intact  Rad 2+  Skin:    General: Skin is warm and dry.  Neurological:     Mental Status: He is alert.  Psychiatric:        Mood and Affect: Mood normal.        Behavior: Behavior normal.     Assessment/Plan: Left hand injury -- Dr. Yehuda Budd to review, suspect will elect to fix OP.    Freeman Caldron, PA-C Orthopedic Surgery [phone] 12/19/2022, 2:40 PM

## 2022-12-19 NOTE — ED Provider Triage Note (Signed)
Emergency Medicine Provider Triage Evaluation Note  Logan Townsend , a 47 y.o. male  was evaluated in triage.  Pt complains of near finger amputation.  Review of Systems  Positive: Multiple left hand wounds Negative: Other injury  Physical Exam  BP 124/77   Pulse (!) 58   Temp 98.5 F (36.9 C) (Oral)   Resp 18   Ht 5\' 9"  (1.753 m)   Wt 72.6 kg   SpO2 99%   BMI 23.64 kg/m  Gen:   Awake, Diaphoretic Resp:  Normal effort  MSK:   Left index finger partial amputation distally. Wounds to 3rd and fourth fingers less extensive Other:    Medical Decision Making  Medically screening exam initiated at 2:00 PM.  Appropriate orders placed.  Vincient Garcia-Gonzalez was informed that the remainder of the evaluation will be completed by another provider, this initial triage assessment does not replace that evaluation, and the importance of remaining in the ED until their evaluation is complete.  Wounds bandaged. Imaging ordered.    Elpidio Anis, PA-C 12/19/22 1402

## 2022-12-19 NOTE — Discharge Instructions (Addendum)
You were seen in the emergency department for an injury to your left hand You have fractured the tip of your left index finger and there are multiple open lacerations We updated your tetanus here and washed out the wounds We gave you 1 dose of antibiotics The wounds were dressed after cleaning them out but she will need to follow-up with a hand surgeon to have surgery on his hand Please call the office of Dr. Yehuda Budd to make an appointment to be seen in the office tomorrow or Wednesday You need to pick up the prescription for 2 different antibiotics we have called into your pharmacy ([facility] on [address] this) We have also called in a prescription for morphine for you to take as directed for severe pain You should take Tylenol or Motrin as directed for mild to moderate pain Do not drink alcohol or drive while taking morphine Return to the emergency department for severe pain, uncontrolled bleeding or any other concerns

## 2022-12-19 NOTE — ED Notes (Signed)
EDP at bedside  

## 2022-12-19 NOTE — ED Triage Notes (Addendum)
Pt states was using a table saw to cut wood and cut fingers on his left hand. The tip of pt's second finger is hanging off. The 3rd and 4th finger are cut half way through

## 2023-01-29 IMAGING — DX DG FINGER RING 2+V*R*
3 series · 3 of 3 positions shown · non-contrast
Comparison: None.

CLINICAL DATA: Pain and swelling.

EXAM:
RIGHT RING FINGER 2+V

[finger ap]
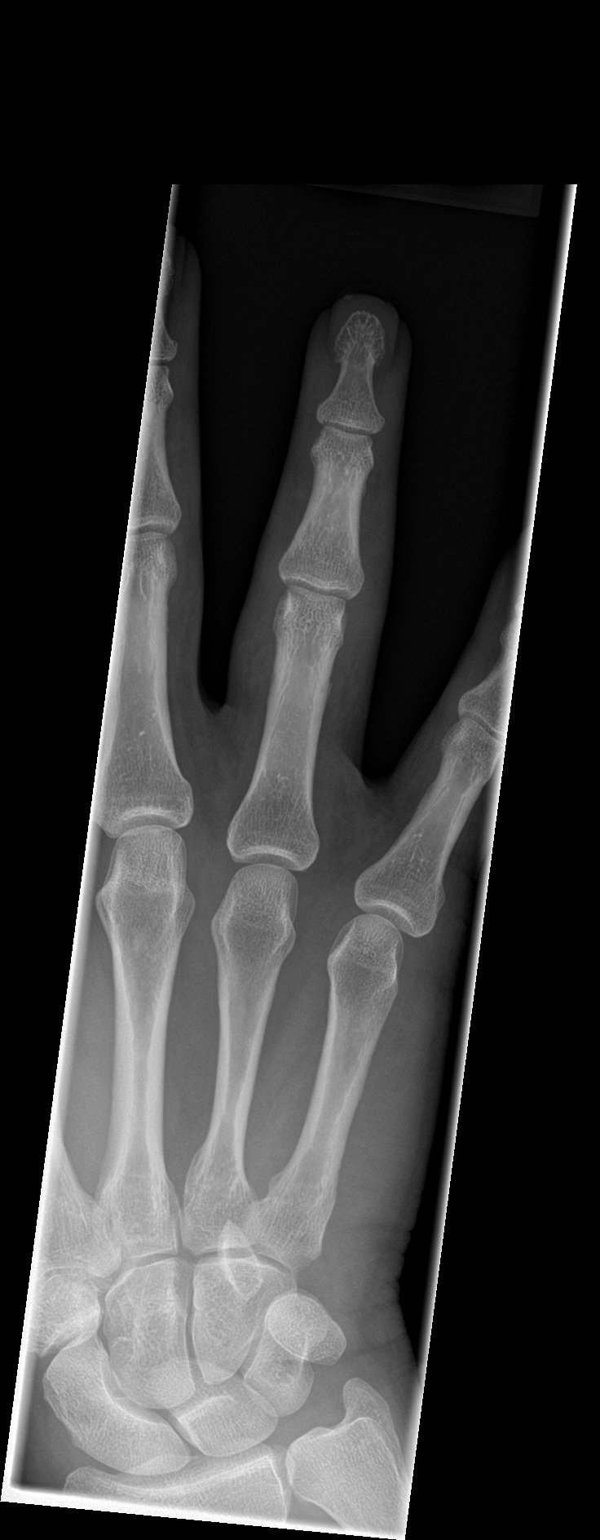

[finger obl]
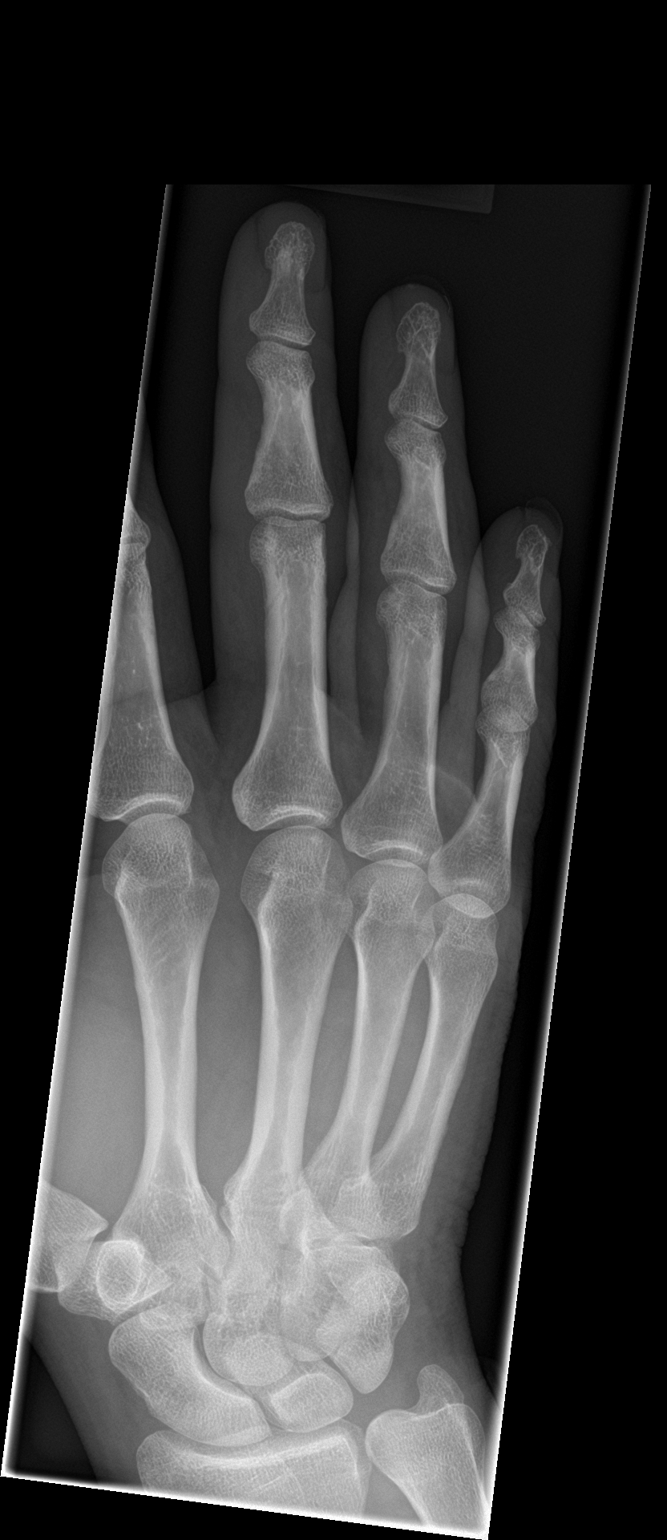

[finger lat]
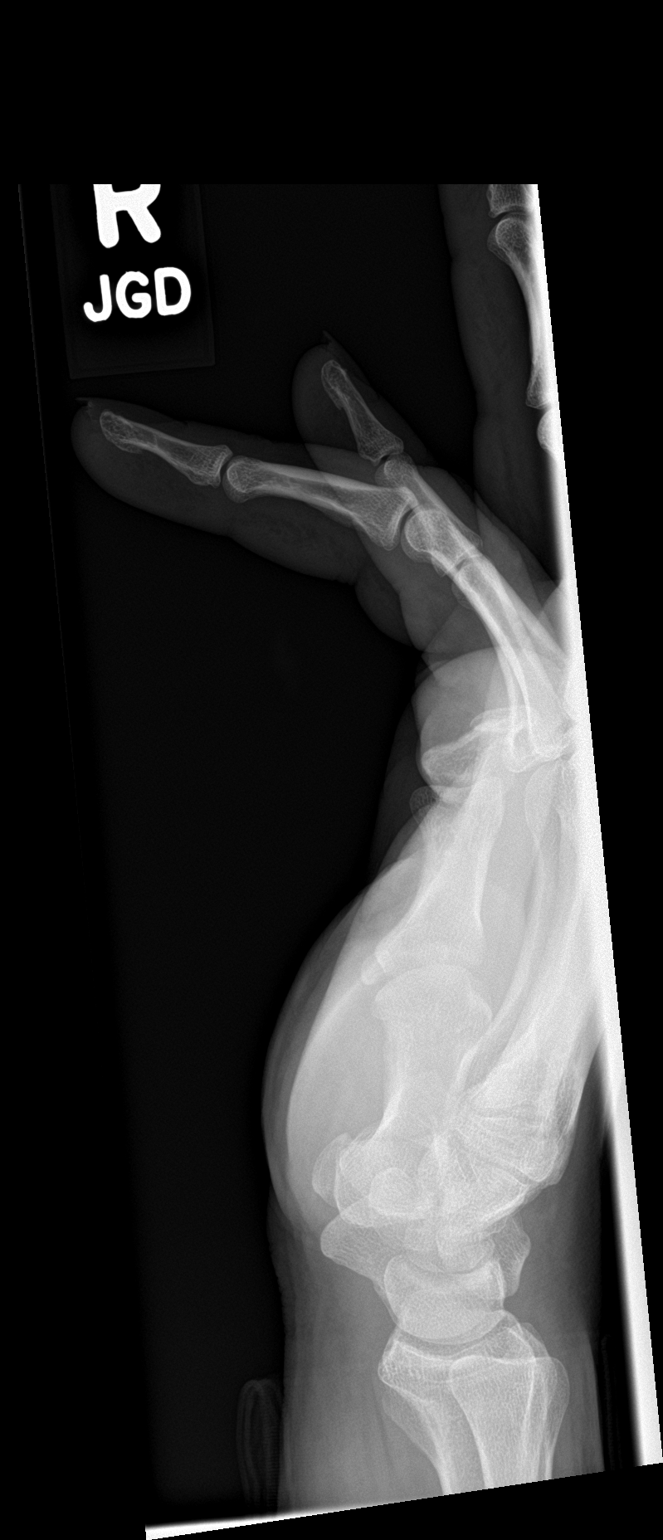

[3 of 3 positions shown; findings below may reference images not displayed]

FINDINGS: There is no evidence of fracture or dislocation. There is no
evidence of arthropathy or other focal bone abnormality. Soft
tissues are unremarkable. No evidence for soft tissue gas or
radiopaque soft tissue foreign body.
IMPRESSION: Negative.
